# Patient Record
Sex: Female | Born: 1980
Health system: Southern US, Community
[De-identification: ages and names within clinical notes are randomized; demographics above are authoritative.]

---

## 2005-09-15 ENCOUNTER — Emergency Department (HOSPITAL_COMMUNITY): Admission: EM | Admit: 2005-09-15 | Discharge: 2005-09-15 | Payer: Self-pay | Admitting: Emergency Medicine

## 2013-03-29 ENCOUNTER — Other Ambulatory Visit (HOSPITAL_COMMUNITY): Payer: Self-pay | Admitting: Obstetrics and Gynecology

## 2013-03-29 DIAGNOSIS — Z3141 Encounter for fertility testing: Secondary | ICD-10-CM

## 2013-04-04 ENCOUNTER — Ambulatory Visit (HOSPITAL_COMMUNITY)
Admission: RE | Admit: 2013-04-04 | Discharge: 2013-04-04 | Disposition: A | Discharge status: Home / Self Care | Payer: BC Managed Care – PPO | Source: Ambulatory Visit | Attending: Obstetrics and Gynecology | Admitting: Obstetrics and Gynecology

## 2013-04-04 DIAGNOSIS — Z3141 Encounter for fertility testing: Secondary | ICD-10-CM

## 2013-04-04 DIAGNOSIS — N979 Female infertility, unspecified: Secondary | ICD-10-CM | POA: Insufficient documentation

## 2013-04-04 MED ORDER — IOHEXOL 300 MG/ML  SOLN
20.0000 mL | Freq: Once | INTRAMUSCULAR | Status: AC | PRN
  Administered 2013-04-04: 8 mL

## 2015-09-23 IMAGING — RF DG HYSTEROGRAM
6 series · 12 of 12 positions shown · non-contrast
Comparison: None.

CLINICAL DATA: Infertility.

EXAM:
HYSTEROSALPINGOGRAM
TECHNIQUE: Hysterosalpingogram was performed by the ordering physician under
fluoroscopy. Fluoroscopic images were submitted for radiologic
interpretation following the procedure. Please see the procedural
report for the amount of contrast and the fluoroscopy time utilized.

[Series 1: run · 2 of 2 slices shown (1 of 6)]
[im 1/2]
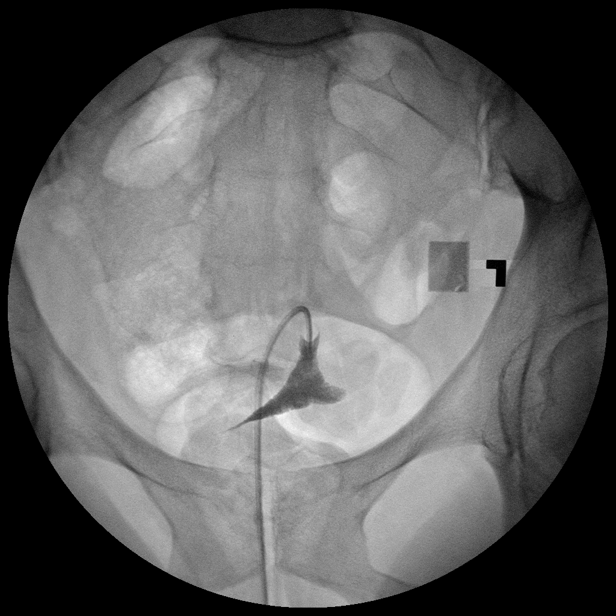
[im 2/2]
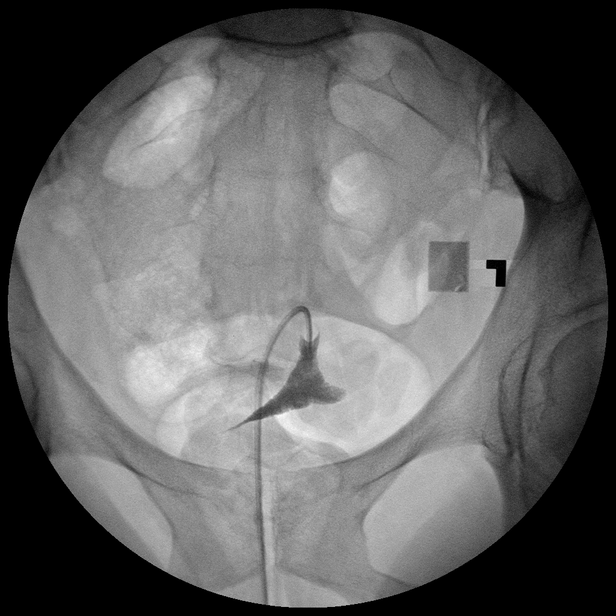

[Series 2: run · 2 of 2 slices shown (2 of 6)]
[im 1/2]
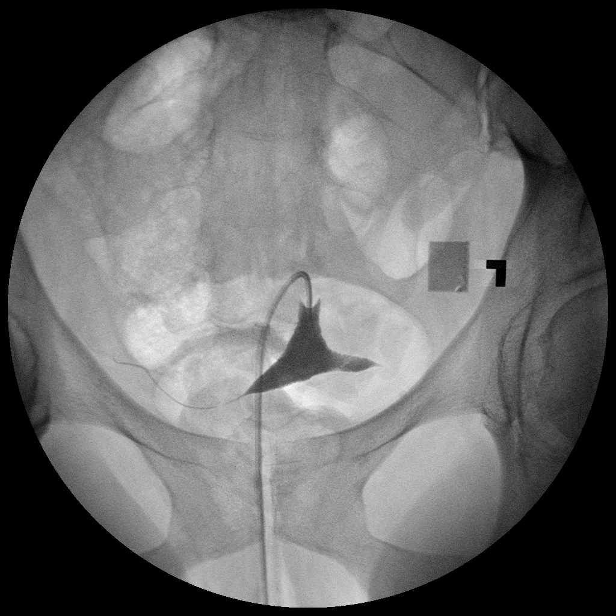
[im 2/2]
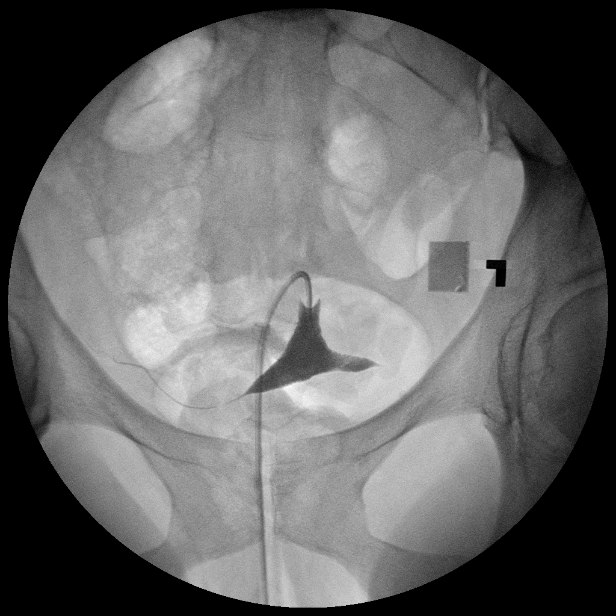

[Series 3: run · 2 of 2 slices shown (3 of 6)]
[im 1/2]
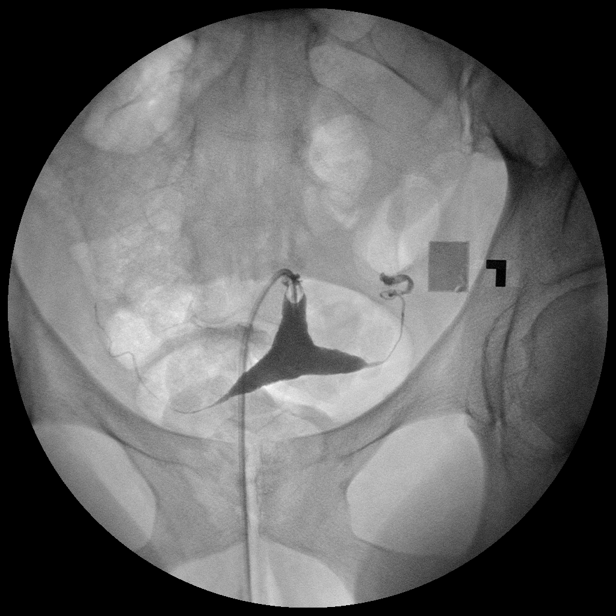
[im 2/2]
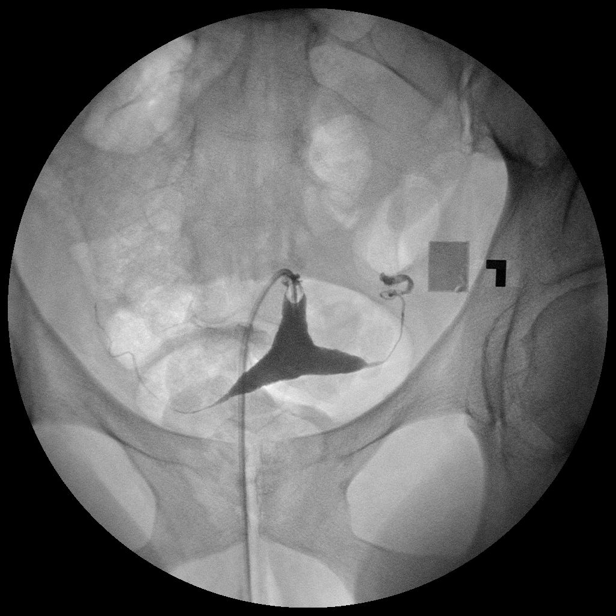

[Series 4: run · 2 of 2 slices shown (4 of 6)]
[im 1/2]
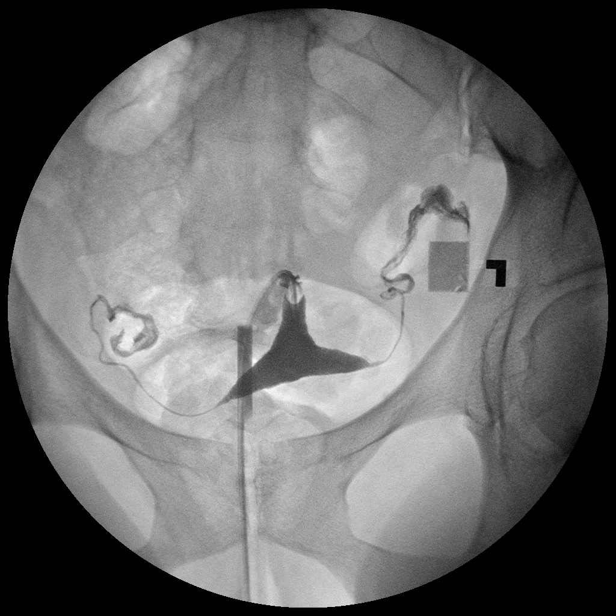
[im 2/2]
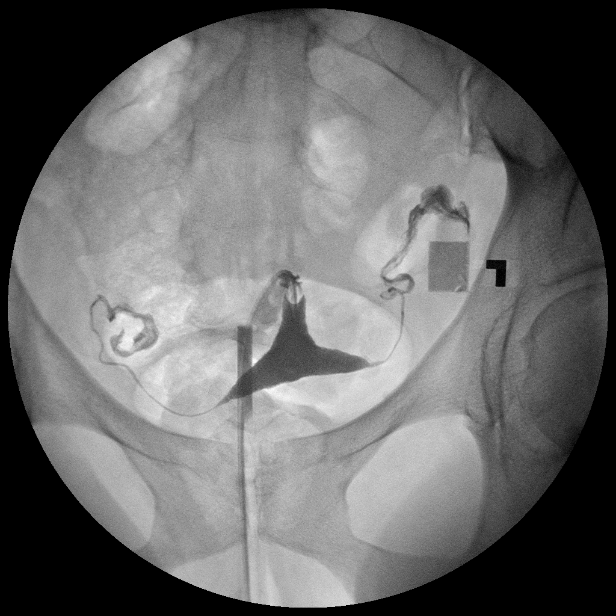

[Series 5: run · 2 of 2 slices shown (5 of 6)]
[im 1/2]
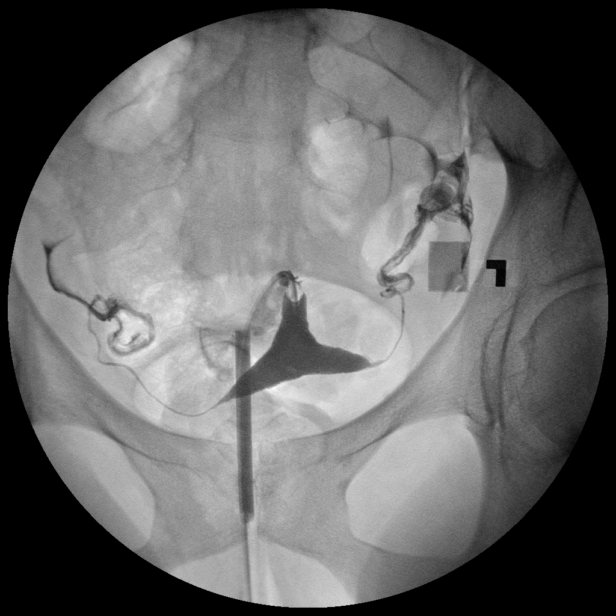
[im 2/2]
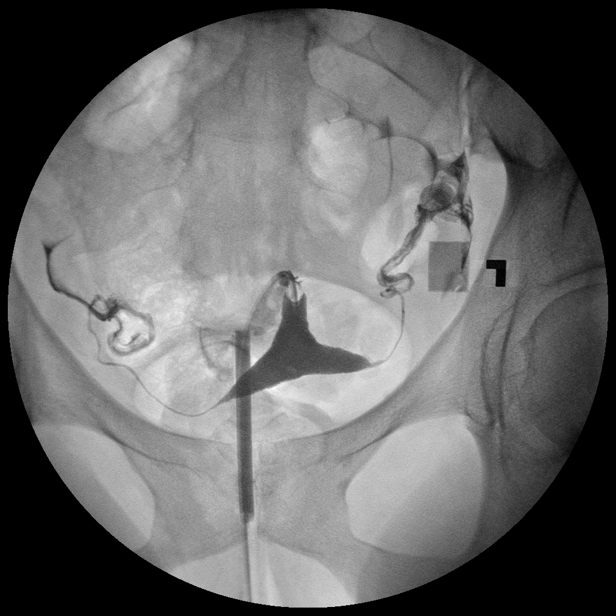

[Series 6: run · 2 of 2 slices shown (6 of 6)]
[im 1/2]
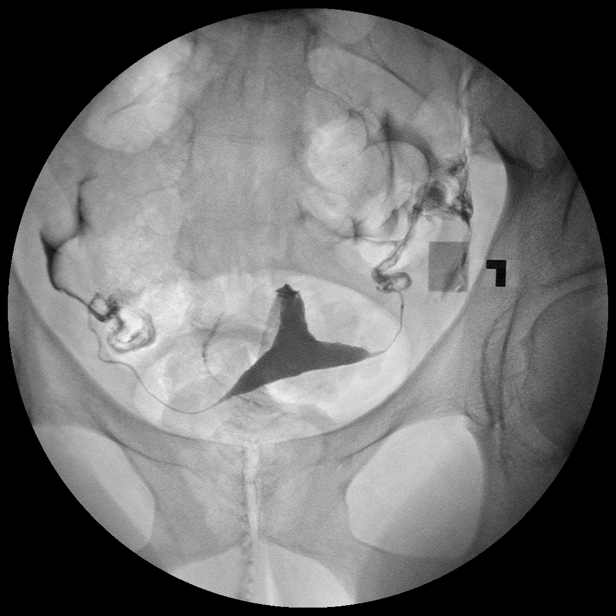
[im 2/2]
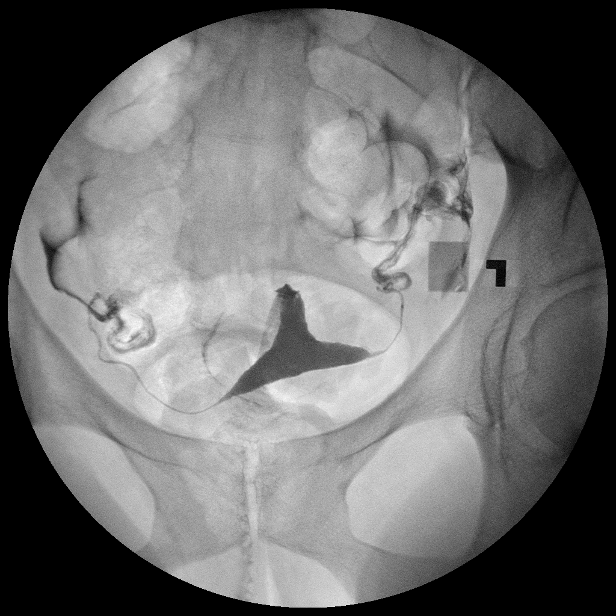

[12 of 12 positions shown; findings below may reference images not displayed]

FINDINGS: The endometrial cavity is normal in appearance and contour. No signs
of Mullerian duct anomaly.

Opacification of both fallopian tubes is seen. Both tubes appear
normal. Intraperitoneal spill of contrast from both fallopian tubes
is demonstrated.
IMPRESSION: Normal study. Both fallopian tubes are patent.

## 2021-12-30 DIAGNOSIS — N6022 Fibroadenosis of left breast: Secondary | ICD-10-CM | POA: Diagnosis not present

## 2022-03-05 DIAGNOSIS — B001 Herpesviral vesicular dermatitis: Secondary | ICD-10-CM | POA: Diagnosis not present

## 2023-11-04 DIAGNOSIS — R1084 Generalized abdominal pain: Secondary | ICD-10-CM | POA: Diagnosis not present

## 2023-11-05 DIAGNOSIS — K625 Hemorrhage of anus and rectum: Secondary | ICD-10-CM | POA: Diagnosis not present

## 2024-02-09 DIAGNOSIS — Z0000 Encounter for general adult medical examination without abnormal findings: Secondary | ICD-10-CM | POA: Diagnosis not present

## 2024-02-09 DIAGNOSIS — Z13220 Encounter for screening for lipoid disorders: Secondary | ICD-10-CM | POA: Diagnosis not present

## 2024-02-09 DIAGNOSIS — Z1329 Encounter for screening for other suspected endocrine disorder: Secondary | ICD-10-CM | POA: Diagnosis not present
